# Patient Record
Sex: Male | Born: 1990 | Race: Black or African American | Hispanic: No | Marital: Single | Smoking: Current some day smoker
Health system: Southern US, Community
[De-identification: ages and names within clinical notes are randomized; demographics above are authoritative.]

---
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
## 2017-05-22 ENCOUNTER — Emergency Department (HOSPITAL_COMMUNITY)
Admission: EM | Admit: 2017-05-22 | Discharge: 2017-05-23 | Disposition: A | Discharge status: Home / Self Care | Payer: Self-pay | Attending: Emergency Medicine | Admitting: Emergency Medicine

## 2017-05-22 ENCOUNTER — Emergency Department (HOSPITAL_COMMUNITY): Payer: Self-pay

## 2017-05-22 ENCOUNTER — Encounter (HOSPITAL_COMMUNITY): Payer: Self-pay | Admitting: Emergency Medicine

## 2017-05-22 DIAGNOSIS — R360 Urethral discharge without blood: Secondary | ICD-10-CM | POA: Insufficient documentation

## 2017-05-22 DIAGNOSIS — N50811 Right testicular pain: Secondary | ICD-10-CM | POA: Insufficient documentation

## 2017-05-22 DIAGNOSIS — F17200 Nicotine dependence, unspecified, uncomplicated: Secondary | ICD-10-CM | POA: Insufficient documentation

## 2017-05-22 LAB — URINALYSIS, ROUTINE W REFLEX MICROSCOPIC
BILIRUBIN URINE: NEGATIVE
Glucose, UA: NEGATIVE mg/dL
HGB URINE DIPSTICK: NEGATIVE
Ketones, ur: NEGATIVE mg/dL
Leukocytes, UA: NEGATIVE
NITRITE: NEGATIVE
PROTEIN: NEGATIVE mg/dL
Specific Gravity, Urine: 1.013 (ref 1.005–1.030)
pH: 8 (ref 5.0–8.0)

## 2017-05-22 LAB — COMPREHENSIVE METABOLIC PANEL
ALT: 17 U/L (ref 17–63)
AST: 24 U/L (ref 15–41)
Albumin: 4.6 g/dL (ref 3.5–5.0)
Alkaline Phosphatase: 51 U/L (ref 38–126)
Anion gap: 11 (ref 5–15)
BUN: 7 mg/dL (ref 6–20)
CO2: 26 mmol/L (ref 22–32)
CREATININE: 1.16 mg/dL (ref 0.61–1.24)
Calcium: 9.7 mg/dL (ref 8.9–10.3)
Chloride: 100 mmol/L — ABNORMAL LOW (ref 101–111)
GFR calc Af Amer: >60 mL/min (ref >60)
Glucose, Bld: 100 mg/dL — ABNORMAL HIGH (ref 65–99)
POTASSIUM: 3.8 mmol/L (ref 3.5–5.1)
Sodium: 137 mmol/L (ref 135–145)
TOTAL PROTEIN: 7.7 g/dL (ref 6.5–8.1)
Total Bilirubin: 0.6 mg/dL (ref 0.3–1.2)

## 2017-05-22 LAB — CBC WITH DIFFERENTIAL/PLATELET
BASOS ABS: 0 10*3/uL (ref 0.0–0.1)
Basophils Relative: 1 %
EOS PCT: 1 %
Eosinophils Absolute: 0.1 10*3/uL (ref 0.0–0.7)
HEMATOCRIT: 42.6 % (ref 39.0–52.0)
Hemoglobin: 13.9 g/dL (ref 13.0–17.0)
LYMPHS ABS: 2.8 10*3/uL (ref 0.7–4.0)
LYMPHS PCT: 44 %
MCH: 27.6 pg (ref 26.0–34.0)
MCHC: 32.6 g/dL (ref 30.0–36.0)
MCV: 84.7 fL (ref 78.0–100.0)
MONO ABS: 0.5 10*3/uL (ref 0.1–1.0)
MONOS PCT: 7 %
NEUTROS ABS: 3 10*3/uL (ref 1.7–7.7)
Neutrophils Relative %: 47 %
PLATELETS: 342 10*3/uL (ref 150–400)
RBC: 5.03 MIL/uL (ref 4.22–5.81)
RDW: 13.4 % (ref 11.5–15.5)
WBC: 6.3 10*3/uL (ref 4.0–10.5)

## 2017-05-22 NOTE — ED Provider Notes (Signed)
[facility]-EMERGENCY DEPT Provider Note   CSN: [id number] Arrival date & time: 05/22/17  1914     History   Chief Complaint Chief Complaint  Patient presents with  . Right Testicle Throbbibg    HPI Larry Ayala is a 26 y.o. male.  The history is provided by the patient and medical records.    26 year old male here with lower abdominal discomfort and right testicular pain.  Reports she noticed this this morning. States his testicle hurts more than anything. States localized to right testicle only, described as a mild, throbbing sensation. He denies any dysuria or hematuria. No nausea or vomiting. States he did notice a little bit of clear penile discharge. Does report recent sexual intercourse.  No hx of STD.    History reviewed. No pertinent past medical history.  There are no active problems to display for this patient.   History reviewed. No pertinent surgical history.     Home Medications    Prior to Admission medications   Not on File    Family History No family history on file.  Social History Social History  Substance Use Topics  . Smoking status: Current Some Day Smoker  . Smokeless tobacco: Never Used  . Alcohol use No     Allergies   Patient has no known allergies.   Review of Systems Review of Systems  Gastrointestinal: Positive for abdominal pain.  Genitourinary: Positive for testicular pain.  All other systems reviewed and are negative.    Physical Exam Updated Vital Signs BP 130/70 (BP Location: Left Arm)   Pulse (!) 57   Temp 99 F (37.2 C) (Oral)   Resp 16   Ht 5\' 6"  (1.676 m)   Wt 65.3 kg (144 lb)   SpO2 100%   BMI 23.24 kg/m   Physical Exam  Constitutional: He is oriented to person, place, and time. He appears well-developed and well-nourished.  HENT:  Head: Normocephalic and atraumatic.  Mouth/Throat: Oropharynx is clear and moist.  Eyes: Conjunctivae and EOM are normal. Pupils are equal, round, and reactive to light.  Neck:  Normal range of motion.  Cardiovascular: Normal rate, regular rhythm and normal heart sounds.   Pulmonary/Chest: Effort normal and breath sounds normal.  Abdominal: Soft. Bowel sounds are normal. There is no tenderness. There is no rigidity and no guarding.  Genitourinary: Circumcised. Penile erythema present. Discharge (clear) found.  Genitourinary Comments: No significant testicular swelling or tenderness; normal lie; no masses; no lymphadenopathy; no hernia; small amount of clear penile discharge noted  Musculoskeletal: Normal range of motion.  Neurological: He is alert and oriented to person, place, and time.  Skin: Skin is warm and dry.  Psychiatric: He has a normal mood and affect.  Nursing note and vitals reviewed.    ED Treatments / Results  Labs (all labs ordered are listed, but only abnormal results are displayed) Labs Reviewed  COMPREHENSIVE METABOLIC PANEL - Abnormal; Notable for the following:       Result Value   Chloride 100 (*)    Glucose, Bld 100 (*)    All other components within normal limits  CBC WITH DIFFERENTIAL/PLATELET  URINALYSIS, ROUTINE W REFLEX MICROSCOPIC  GC/CHLAMYDIA PROBE AMP ([place]) NOT AT [facility]    EKG  EKG Interpretation None       Radiology Kore[place] Scrotum  Result Date: 05/23/2017 CLINICAL DATA:  Initial evaluation for acute right testicular pain. EXAM: SCROTAL ULTRASOUND DOPPLER ULTRASOUND OF THE TESTICLES TECHNIQUE: Complete ultrasound examination of the testicles, epididymis,  and other scrotal structures was performed. Color and spectral Doppler ultrasound were also utilized to evaluate blood flow to the testicles. COMPARISON:  None. FINDINGS: Right testicle Measurements: 4.5 x 2.0 x 3.2 cm. No mass or microlithiasis visualized. Left testicle Measurements: 4.0 x 1.8 x 3.0 cm. No mass or microlithiasis visualized. Right epididymis:  Normal in size and appearance. Left epididymis:  Normal in size and appearance. Hydrocele:  None visualized.  Varicocele:  None visualized. Pulsed Doppler interrogation of both testes demonstrates normal low resistance arterial and venous waveforms bilaterally. IMPRESSION: Normal testicular ultrasound.  No acute abnormality identified. Electronically Signed   By: Rise Mu M.D.   On: 05/23/2017 00:00   [place] Art/ven Flow Abd Pelv Doppler  Result Date: 05/23/2017 CLINICAL DATA:  Initial evaluation for acute right testicular pain. EXAM: SCROTAL ULTRASOUND DOPPLER ULTRASOUND OF THE TESTICLES TECHNIQUE: Complete ultrasound examination of the testicles, epididymis, and other scrotal structures was performed. Color and spectral Doppler ultrasound were also utilized to evaluate blood flow to the testicles. COMPARISON:  None. FINDINGS: Right testicle Measurements: 4.5 x 2.0 x 3.2 cm. No mass or microlithiasis visualized. Left testicle Measurements: 4.0 x 1.8 x 3.0 cm. No mass or microlithiasis visualized. Right epididymis:  Normal in size and appearance. Left epididymis:  Normal in size and appearance. Hydrocele:  None visualized. Varicocele:  None visualized. Pulsed Doppler interrogation of both testes demonstrates normal low resistance arterial and venous waveforms bilaterally. IMPRESSION: Normal testicular ultrasound.  No acute abnormality identified. Electronically Signed   By: Rise Mu M.D.   On: 05/23/2017 00:00    Procedures Procedures (including critical care time)  Medications Ordered in ED Medications - No data to display   Initial Impression / Assessment and Plan / ED Course  I have reviewed the triage vital signs and the nursing notes.  Pertinent labs & imaging results that were available during my care of the patient were reviewed by me and considered in my medical decision making (see chart for details).  26 year old male here with lower abdominal discomfort and right testicular pain. Denies any recent groin trauma. No urinary symptoms. Has noticed some clear penile discharge. He  is afebrile and nontoxic. Abdomen is soft and benign. No abnormalities noted on genital exam. Screening lab work reassuring. UA without signs of infection. Gc/chl pending.  Scrotal ultrasound Doppler obtained, no acute findings. Feel patient is appropriate for discharge. Referred to urology if any ongoing issues. Informed he will be notified if his STD cultures are positive or require treatment.  Discussed plan with patient, he acknowledged understanding and agreed with plan of care.  Return precautions given for new or worsening symptoms.  Final Clinical Impressions(s) / ED Diagnoses   Final diagnoses:  Pain in right testicle    New Prescriptions New Prescriptions   No medications on file     Garlon Hatchet, PA-C 05/23/17 1610    Jacalyn Lefevre, MD 05/23/17 2037

## 2017-05-22 NOTE — ED Triage Notes (Signed)
Patient reports right testicular " throbbing" onset today with mild low abdominal discomfort , denies dysuria , pt. suspects exposure to " black mold".

## 2017-05-23 NOTE — Discharge Instructions (Signed)
As we discussed, labs and ultrasound today looked good. Can follow-up with urology if any ongoing issues. Return to the ED for new or worsening symptoms.

## 2018-06-07 ENCOUNTER — Other Ambulatory Visit: Payer: Self-pay

## 2018-06-07 ENCOUNTER — Emergency Department (HOSPITAL_COMMUNITY): Payer: Self-pay

## 2018-06-07 ENCOUNTER — Encounter (HOSPITAL_COMMUNITY): Payer: Self-pay | Admitting: Emergency Medicine

## 2018-06-07 ENCOUNTER — Emergency Department (HOSPITAL_COMMUNITY)
Admission: EM | Admit: 2018-06-07 | Discharge: 2018-06-07 | Disposition: A | Discharge status: Home / Self Care | Payer: Self-pay | Attending: Emergency Medicine | Admitting: Emergency Medicine

## 2018-06-07 DIAGNOSIS — S62366A Nondisplaced fracture of neck of fifth metacarpal bone, right hand, initial encounter for closed fracture: Secondary | ICD-10-CM

## 2018-06-07 DIAGNOSIS — W228XXA Striking against or struck by other objects, initial encounter: Secondary | ICD-10-CM | POA: Insufficient documentation

## 2018-06-07 DIAGNOSIS — Y998 Other external cause status: Secondary | ICD-10-CM | POA: Insufficient documentation

## 2018-06-07 DIAGNOSIS — Z4789 Encounter for other orthopedic aftercare: Secondary | ICD-10-CM | POA: Insufficient documentation

## 2018-06-07 DIAGNOSIS — Z4689 Encounter for fitting and adjustment of other specified devices: Secondary | ICD-10-CM

## 2018-06-07 DIAGNOSIS — Y929 Unspecified place or not applicable: Secondary | ICD-10-CM | POA: Insufficient documentation

## 2018-06-07 DIAGNOSIS — Y9389 Activity, other specified: Secondary | ICD-10-CM | POA: Insufficient documentation

## 2018-06-07 DIAGNOSIS — S62346B Nondisplaced fracture of base of fifth metacarpal bone, right hand, initial encounter for open fracture: Secondary | ICD-10-CM | POA: Insufficient documentation

## 2018-06-07 NOTE — ED Triage Notes (Signed)
Wants cast removed from RT forearm has been on for 9 weeks.  Pt states he has not followed up orthopedic Dr in [place] that applied the cast.

## 2018-06-07 NOTE — Discharge Instructions (Signed)
Alternate 600 mg of ibuprofen and [phone] mg of Tylenol every 3 hours as needed for pain. Do not exceed 4000 mg of Tylenol daily.  Take ibuprofen with food.  Apply ice for comfort.  Follow-up with an orthopedist if you have any ongoing problems.  Return to the emergency department if any concerning signs or symptoms develop such as fever, swelling, loss of pulses, or loss of sensation.
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
## 2019-07-29 IMAGING — DX DG WRIST COMPLETE 3+V*R*
3 series · 3 of 3 positions shown · non-contrast
Comparison: None.

CLINICAL DATA: History of fracture of the fifth metacarpal 2 months
ago still in cast

EXAM:
RIGHT WRIST - COMPLETE 3+ VIEW

[wrist pa]
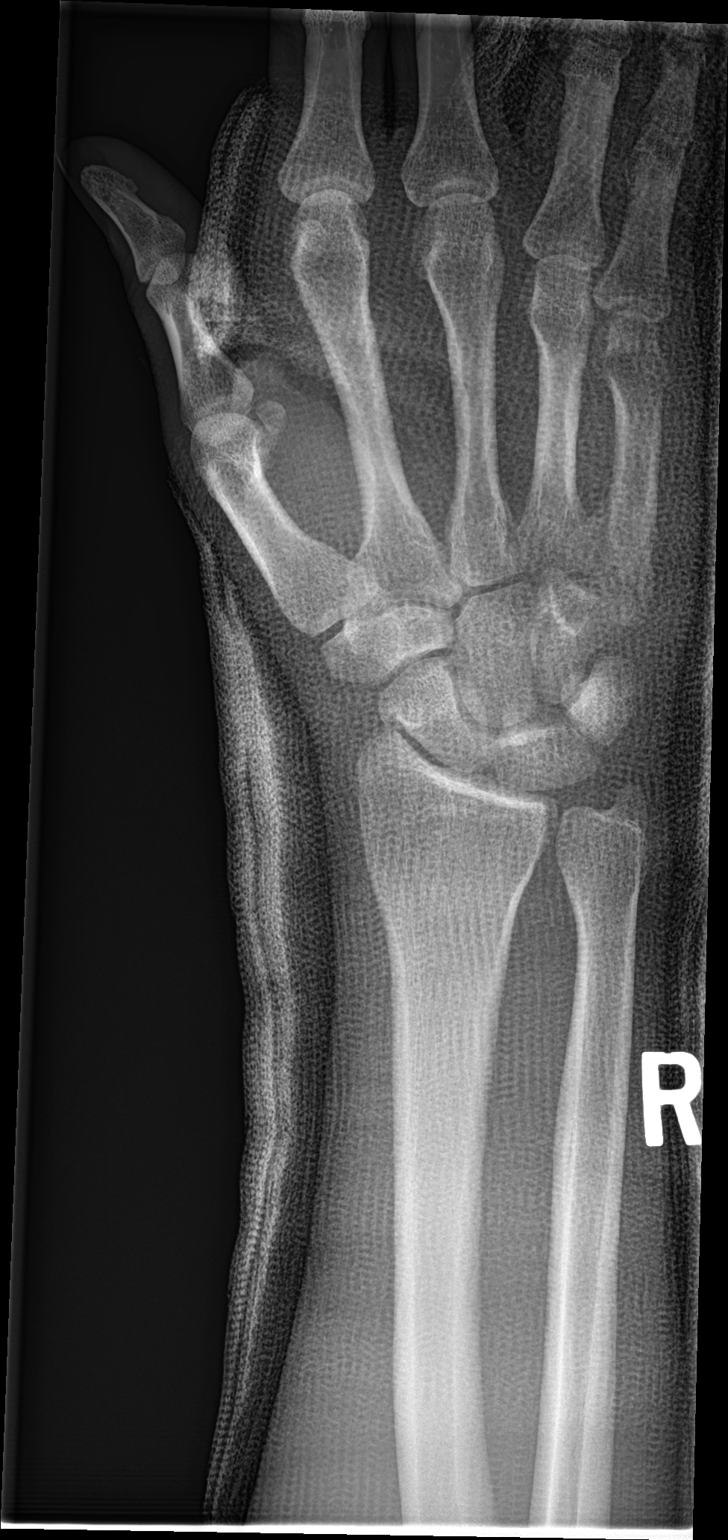

[wrist obl]
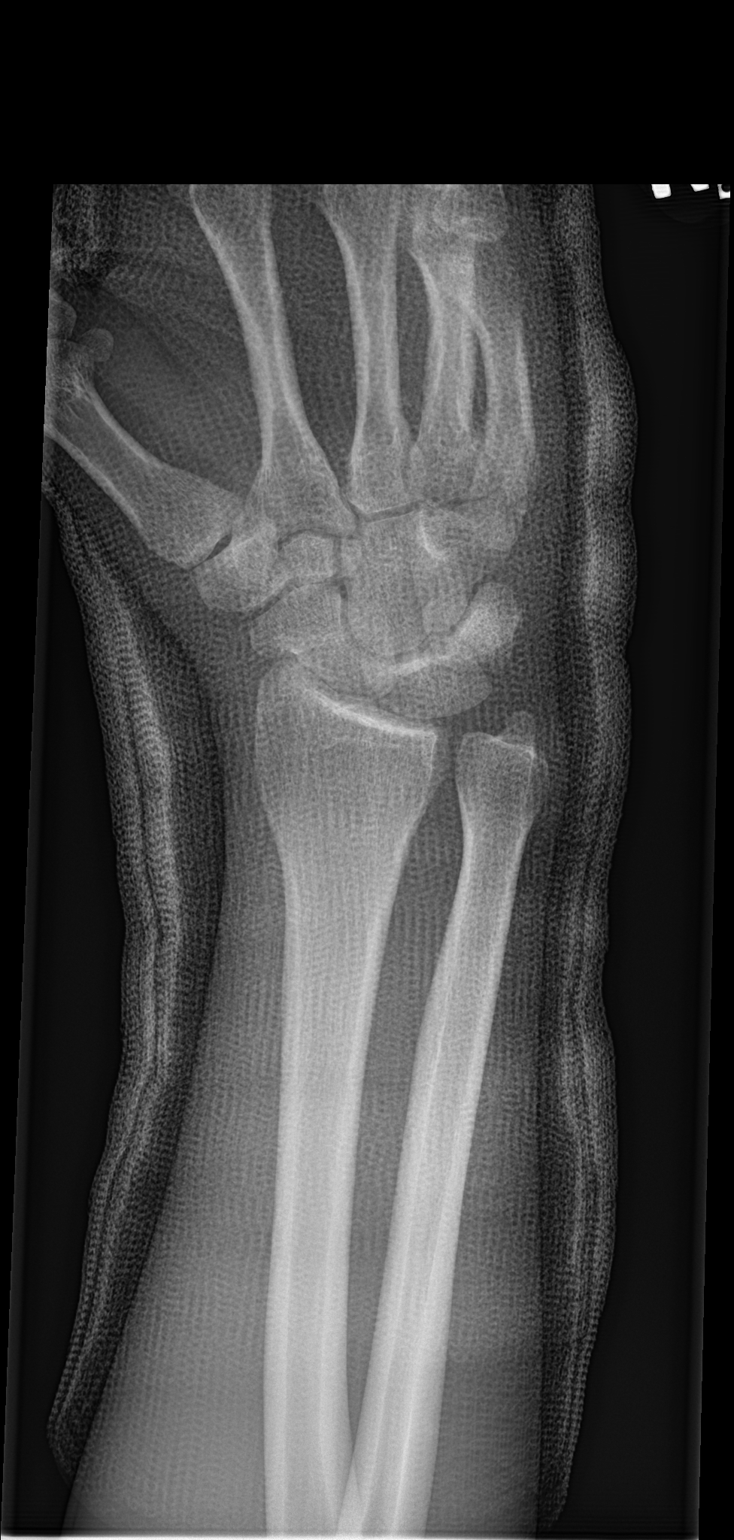

[wrist lat]
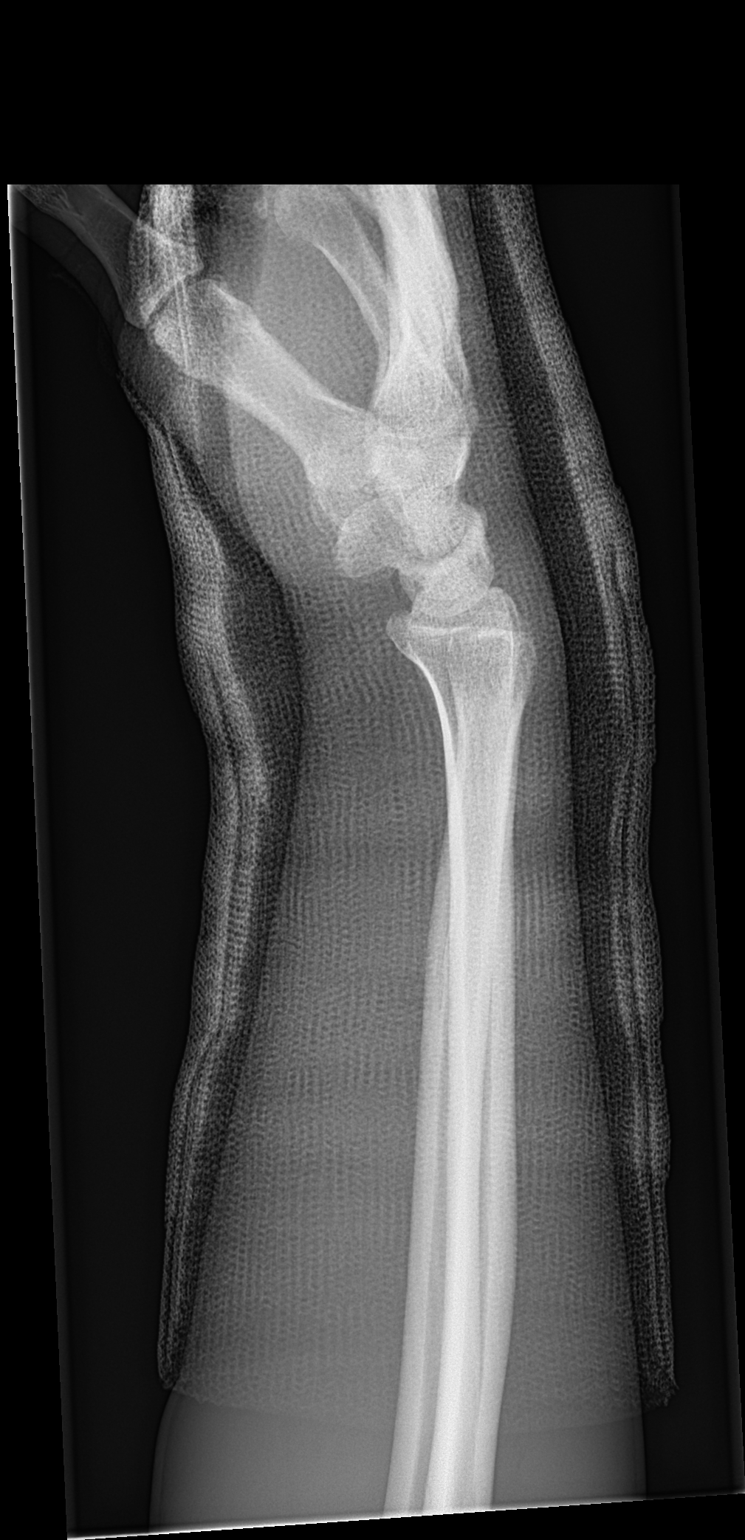

[3 of 3 positions shown; findings below may reference images not displayed]

FINDINGS: Unfortunately bony detail is obscured by overlying cast. However
there does appear to be callus formation around the fracture of the
base of the fifth metacarpal. The radiocarpal joint space appears
normal and the carpal bones are in normal position. The carpal bones
are normally aligned.
IMPRESSION: Some callus formation is noted around the fracture of the base of
the fifth metacarpal, but bony detail is obscured by overlying cast.

## 2019-08-12 ENCOUNTER — Emergency Department (HOSPITAL_COMMUNITY): Payer: Self-pay

## 2019-08-12 ENCOUNTER — Other Ambulatory Visit: Payer: Self-pay

## 2019-08-12 ENCOUNTER — Emergency Department (HOSPITAL_COMMUNITY)
Admission: EM | Admit: 2019-08-12 | Discharge: 2019-08-12 | Disposition: A | Discharge status: Home / Self Care | Payer: Self-pay | Attending: Emergency Medicine | Admitting: Emergency Medicine

## 2019-08-12 DIAGNOSIS — R070 Pain in throat: Secondary | ICD-10-CM | POA: Insufficient documentation

## 2019-08-12 DIAGNOSIS — K226 Gastro-esophageal laceration-hemorrhage syndrome: Secondary | ICD-10-CM | POA: Insufficient documentation

## 2019-08-12 DIAGNOSIS — F1210 Cannabis abuse, uncomplicated: Secondary | ICD-10-CM | POA: Insufficient documentation

## 2019-08-12 DIAGNOSIS — F17210 Nicotine dependence, cigarettes, uncomplicated: Secondary | ICD-10-CM | POA: Insufficient documentation

## 2019-08-12 LAB — CBC
HCT: 47.1 % (ref 39.0–52.0)
Hemoglobin: 14.7 g/dL (ref 13.0–17.0)
MCH: 27.8 pg (ref 26.0–34.0)
MCHC: 31.2 g/dL (ref 30.0–36.0)
MCV: 89.2 fL (ref 80.0–100.0)
Platelets: 361 10*3/uL (ref 150–400)
RBC: 5.28 MIL/uL (ref 4.22–5.81)
RDW: 13.6 % (ref 11.5–15.5)
WBC: 11.5 10*3/uL — ABNORMAL HIGH (ref 4.0–10.5)
nRBC: 0 % (ref 0.0–0.2)

## 2019-08-12 LAB — BASIC METABOLIC PANEL
Anion gap: 11 (ref 5–15)
BUN: 11 mg/dL (ref 6–20)
CO2: 27 mmol/L (ref 22–32)
Calcium: 10 mg/dL (ref 8.9–10.3)
Chloride: 104 mmol/L (ref 98–111)
Creatinine, Ser: 0.97 mg/dL (ref 0.61–1.24)
GFR calc Af Amer: >60 mL/min (ref >60)
GFR calc non Af Amer: >60 mL/min (ref >60)
Glucose, Bld: 97 mg/dL (ref 70–99)
Potassium: 4.3 mmol/L (ref 3.5–5.1)
Sodium: 142 mmol/L (ref 135–145)

## 2019-08-12 MED ORDER — PROMETHAZINE HCL 25 MG PO TABS: 25.0000 mg | ORAL_TABLET | Freq: Four times a day (QID) | ORAL | 0 refills | Status: AC | PRN

## 2019-08-12 MED ORDER — IOHEXOL 300 MG/ML  SOLN
75.0000 mL | Freq: Once | INTRAMUSCULAR | Status: AC | PRN
  Administered 2019-08-12: 15:00:00 75 mL via INTRAVENOUS

## 2019-08-12 MED ORDER — LIDOCAINE VISCOUS HCL 2 % MT SOLN: 15.0000 mL | OROMUCOSAL | 0 refills | Status: AC | PRN

## 2019-08-12 NOTE — Discharge Instructions (Addendum)
You were seen in the ER for throat discomfort after vomiting.    CT of your neck today is normal and does not show any foreign bodies or abnormalities in your neck or throat.  For now we will treat your symptoms with antiacid medicines, soft diet.  I suspect you may have caused a slight superficial tear in your esophagus from vomiting.  Small tears usually heal on their own over a few days and are treated with anti-acid medicines, nausea and vomiting control, diet changes.   Take 40 mg of omeprazole (over the counter) every morning on an empty stomach 20 to 30 minutes before eating or drinking.  Start a soft and gentle diet.  Avoid any dry, hard foods.  Avoid any irritating foods such as spicy food, greasy foods or acidic foods.  Avoid alcohol.  Take phenergan every 6 hours for the next 3-5 days to prevent nausea, we need to avoid further vomiting to let esophagus heal.  Oral lidocaine solution can be used to alleviate discomfort.  Gargle approx 15 ml every 3 hours, this may be swallowed.    Your symptoms may take several days to start to improve.  If your symptoms persist and are not improving in the next 3 to 5 days you need to be evaluated by a gastroenterologist.  Symptoms gastroenterology does a throat scope to look at the esophagus and tear.  Return to the ER if there is sudden worsening of throat discomfort, chest pain, shortness of breath, persistent vomiting or blood in your vomit, blood in stool

## 2019-08-12 NOTE — ED Triage Notes (Signed)
Pt says he had rice and shrimp last night.  Woke up this morning and vomited twice and feels like something in his throat.  Able to feel something in throats when he turns his neck.  Denies pain.

## 2019-08-12 NOTE — ED Provider Notes (Addendum)
[facility] EMERGENCY DEPARTMENT Provider Note   CSN: [id number] Arrival date & time: 08/12/19  1216     History   Chief Complaint Chief Complaint  Patient presents with   Airway Obstruction    HPI Larry Ayala is a 28 y.o. male presents to the ER for evaluation of throat discomfort.  Patient states last night he had [place] food very late.  He ate shrimp and rice.  This morning he woke up around 9:30 AM and felt nauseated.  He threw up and after the first time of throwing up he noticed something getting stuck in his throat around his Adam's apple.  Still feels like there is something in there.  He threw up several more times.  On the last occasion he made himself throw up to try and get his throat to feel better.  He noticed some bright red blood streaks in his vomit but with this otherwise yellow and contained food.  He is not sure if this was blood because he had [place] last night as well.  Has since had associated throat discomfort and pain.  It is sore if he touches his Adams apple.  It also feels more uncomfortable if he turns his head to the left and right.  He drank 2 ginger ale's and threw it back up, has been burping since but no relief.  His voice sounds a little bit raspy.  He denies any lip or tongue swelling, chest pain, shortness of breath, pain with breathing, abdominal pain.  No diarrhea.  No fever. He is no longer nauseous.      HPI  No past medical history on file.  There are no active problems to display for this patient.   No past surgical history on file.      Home Medications    Prior to Admission medications   Medication Sig Start Date End Date Taking? Authorizing Provider  lidocaine (XYLOCAINE) 2 % solution Use as directed 15 mLs in the mouth or throat as needed for mouth pain. 08/12/19   Liberty Handy, PA-C  promethazine (PHENERGAN) 25 MG tablet Take 1 tablet (25 mg total) by mouth every 6 (six) hours as needed for nausea or vomiting. 08/12/19    Liberty Handy, PA-C    Family History No family history on file.  Social History Social History   Tobacco Use   Smoking status: Current Some Day Smoker   Smokeless tobacco: Never Used  Substance Use Topics   Alcohol use: No   Drug use: Not Currently    Types: Marijuana     Allergies   Patient has no known allergies.   Review of Systems Review of Systems  HENT: Positive for trouble swallowing and voice change.   Gastrointestinal: Positive for nausea and vomiting.  All other systems reviewed and are negative.    Physical Exam Updated Vital Signs BP 125/83    Pulse 70    Temp 98 F (36.7 C) (Oral)    Resp 16    Ht 5\' 6"  (1.676 m)    Wt 61.2 kg    SpO2 99%    BMI 21.79 kg/m   Physical Exam Vitals signs and nursing note reviewed.  Constitutional:      Appearance: He is well-developed.     Comments: Non toxic.  HENT:     Head: Normocephalic and atraumatic.     Nose: Nose normal.     Mouth/Throat:     Comments: Phonation is normal.  No  lip or tongue angioedema.  Easily visualized oropharynx and tonsils without any abnormalities.  Normal sublingual space.  Tolerating secretions. Eyes:     Conjunctiva/sclera: Conjunctivae normal.  Neck:     Musculoskeletal: Normal range of motion.     Comments: Mild tenderness to thyroid cartilage but no other anterior lateral neck tenderness, crepitus.  Trachea is midline.  Full range of motion of the neck, discomfort noted with extension and right/left rotation. Cardiovascular:     Rate and Rhythm: Normal rate and regular rhythm.     Heart sounds: Normal heart sounds.  Pulmonary:     Effort: Pulmonary effort is normal.     Breath sounds: Normal breath sounds.     Comments: No crepitus to anterior chest wall  Abdominal:     General: Bowel sounds are normal.     Palpations: Abdomen is soft.     Tenderness: There is no abdominal tenderness.     Comments: No G/R/R. No suprapubic or CVA tenderness. Negative Murphy's and  McBurney's  Musculoskeletal: Normal range of motion.  Skin:    General: Skin is warm and dry.     Capillary Refill: Capillary refill takes less than 2 seconds.  Neurological:     Mental Status: He is alert.  Psychiatric:        Behavior: Behavior normal.      ED Treatments / Results  Labs (all labs ordered are listed, but only abnormal results are displayed) Labs Reviewed  CBC - Abnormal; Notable for the following components:      Result Value   WBC 11.5 (*)    All other components within normal limits  BASIC METABOLIC PANEL    EKG None  Radiology Ct Soft Tissue Neck W Contrast  Result Date: 08/12/2019 CLINICAL DATA:  Awoke today with swelling in the throat and pain. Possible allergic reaction to food from last night. EXAM: CT NECK WITH CONTRAST TECHNIQUE: Multidetector CT imaging of the neck was performed using the standard protocol following the bolus administration of intravenous contrast. CONTRAST:  72mL OMNIPAQUE IOHEXOL 300 MG/ML  SOLN COMPARISON:  None. FINDINGS: Pharynx and larynx: Normal. No evidence of inflammatory change or angio edema. Salivary glands: Parotid and submandibular glands are normal. Thyroid: Normal Lymph nodes: No enlarged or low-density nodes on either side of the neck. Vascular: Normal Limited intracranial: Normal Visualized orbits: Normal Mastoids and visualized paranasal sinuses: Clear Skeleton: Normal Upper chest: Normal Other: None IMPRESSION: Normal examination. No convincing evidence of mucosal inflammation or angio edema. Electronically Signed   By: Nelson Chimes M.D.   On: 08/12/2019 15:15    Procedures Procedures (including critical care time)  Medications Ordered in ED Medications  iohexol (OMNIPAQUE) 300 MG/ML solution 75 mL (75 mLs Intravenous Contrast Given 08/12/19 1452)     Initial Impression / Assessment and Plan / ED Course  I have reviewed the triage vital signs and the nursing notes.  Pertinent labs & imaging results that were  available during my care of the patient were reviewed by me and considered in my medical decision making (see chart for details).  Clinical Course as of Aug 11 1545  Sat Aug 12, 2019  1530 IMPRESSION: Normal examination. No convincing evidence of mucosal inflammation or angio edema   CT Soft Tissue Neck W Contrast [CG]    Clinical Course User Index [CG] Kinnie Feil, PA-C   Given history of high suspicion for Mallory-Weiss tear.  Patient had large meal of [place] food last night and woke up  nauseated, vomited several times before symptoms began.  He also had some bright streaks of blood in his emesis and now with some throat discomfort.  Exam is benign, he is tolerating secretions, normal phonation, normal oropharyngeal exam.  He has no chest pain, shortness of breath.  He is hemodynamically stable.  No abdominal pain.  He has not had recurrence of vomiting or bloody emesis since arrival.  He is tolerating fluids here.  Labs reviewed by me.  CT ordered by EDP at triage reviewed by me and radiology, no radiopaque foreign bodies, acute abnormalities noted.  I reevaluated patient he continues to have mild discomfort with swallowing but is tolerating secretions.  No clinical decline.  Given benign exam and history, work-up here I doubt full-thickness tear, perforated ulcer.  He has no abdominal tenderness, history of gastritis, PUD.  He is not on blood thinners.  He is considered low risk for recurrent or life-threatening bleeding.  No known liver or varices history.  Considered low risk for complications from Mallory-Weiss tear.  No previous head neck or other trauma to suggest occult tracheal injury.  He has no chest pain or shortness of breath and I doubt pneumothorax or other intrathoracic pathology.  I do not think he needs emergent GI evaluation or endoscopy here.  Will discharge with omeprazole, lidocaine viscus solution, diet modifications and close GI follow-up.  Instructed patient to call GI  today to make an appointment for reevaluation if symptoms do not improve.  Strict return precautions discussed with patient and girlfriend at bedside, they are in agreement with ER treatment and discharge plan.  Final Clinical Impressions(s) / ED Diagnoses   Final diagnoses:  Throat discomfort  Mallory-Weiss syndrome    ED Discharge Orders         Ordered    promethazine (PHENERGAN) 25 MG tablet  Every 6 hours PRN     08/12/19 1541    lidocaine (XYLOCAINE) 2 % solution  As needed     08/12/19 1545           Liberty HandyGibbons, Pinkie Manger J, PA-C 08/12/19 1624    Vanetta MuldersZackowski, Scott, MD 08/14/19 1221
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
## 2021-07-01 IMAGING — CT CT NECK W/ CM
4 of 8 series · 14 of 33 positions shown, 15 images · IV contrast (omnipaque)
Comparison: None.

CLINICAL DATA: Awoke today with swelling in the throat and pain.
Possible allergic reaction to food from last night.

EXAM:
CT NECK WITH CONTRAST
TECHNIQUE: Multidetector CT imaging of the neck was performed using the
standard protocol following the bolus administration of intravenous
contrast.
CONTRAST:  75mL OMNIPAQUE IOHEXOL 300 MG/ML  SOLN

[Series 2: axial neck · axial · 0.50mm/px · z∈[+77,+221]mm · 4 of 122 slices shown, 5 images]
[im 25/122  soft-tissue]
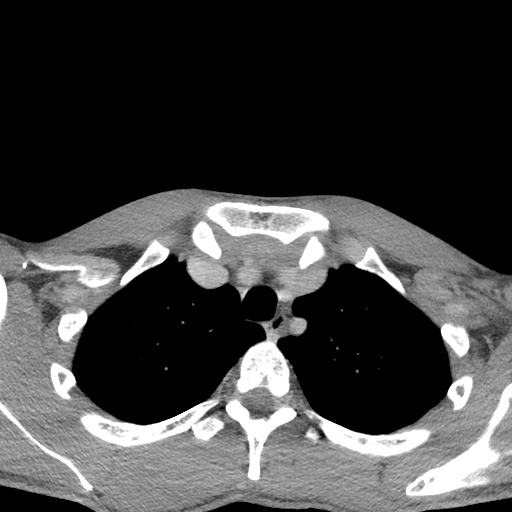
[im 25/122  bone]
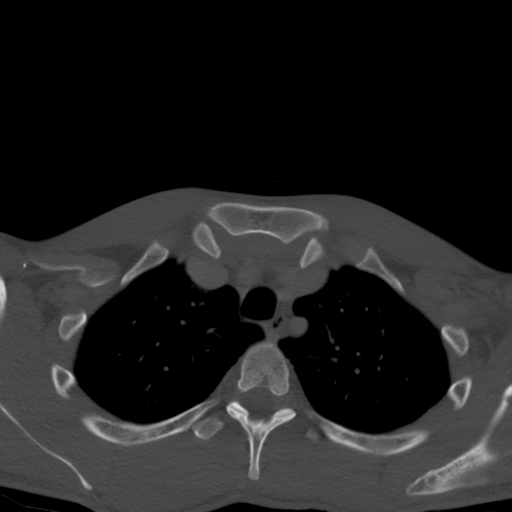
[im 49/122  bone]
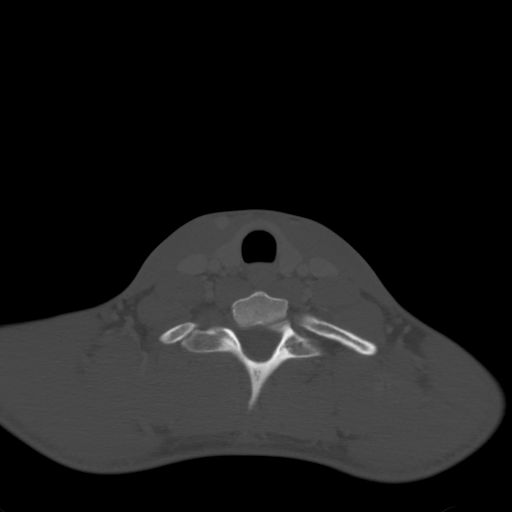
[im 73/122  bone]
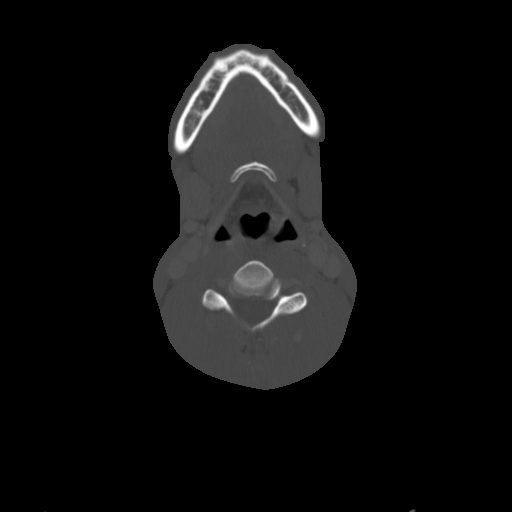
[im 97/122  bone]
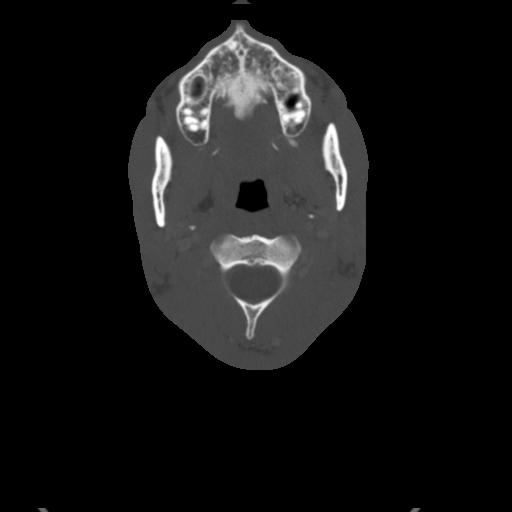

[Series 6: cor neck · coronal · 0.46mm/px · 1 of 119 slices shown]
[im 60/119  bone]
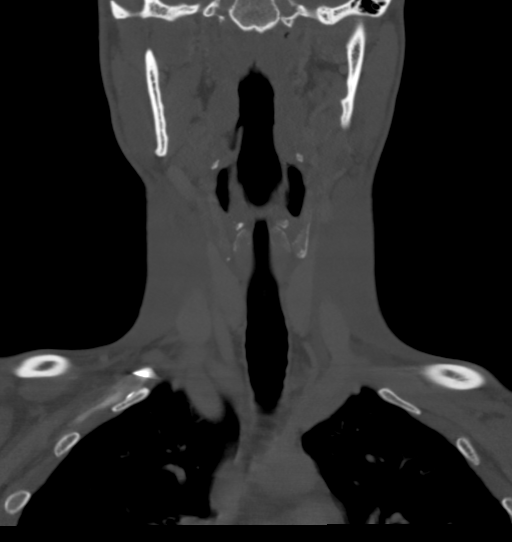

[Series 8: sag neck · sagittal · 0.47mm/px · 5 of 118 slices shown]
[im 20/118  bone]
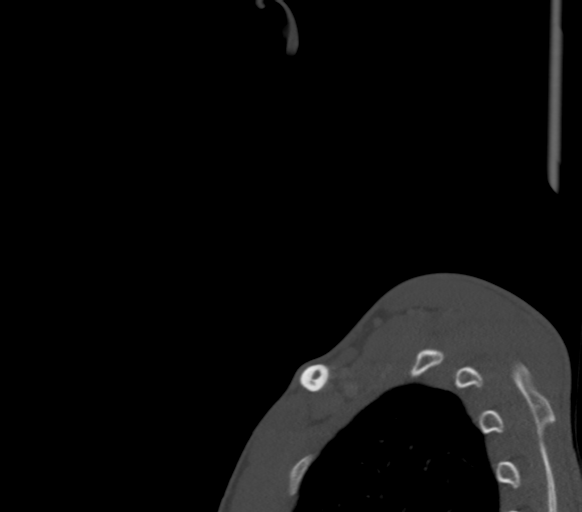
[im 40/118  bone]
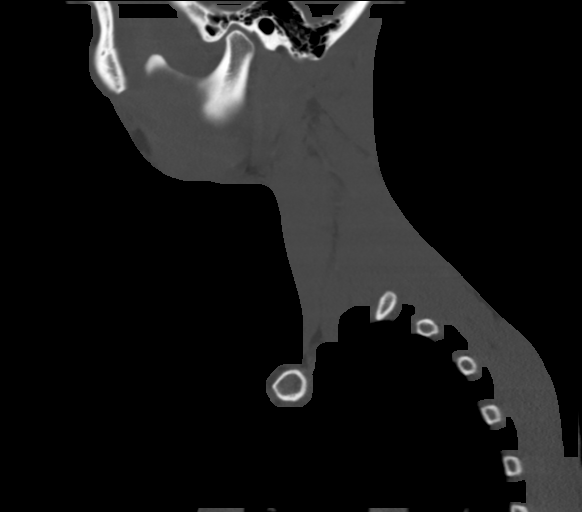
[im 59/118  bone]
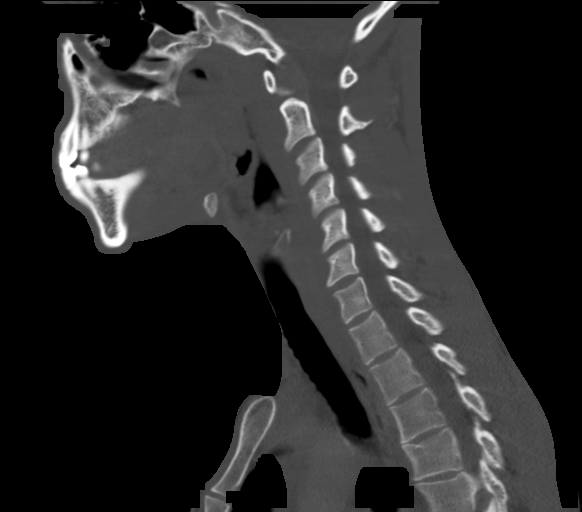
[im 79/118  bone]
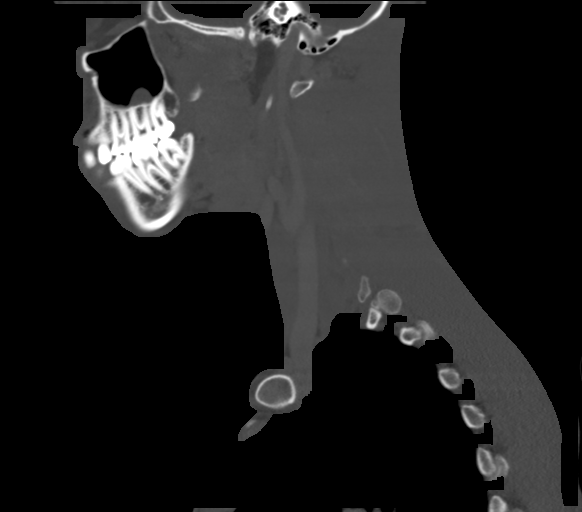
[im 98/118  bone]
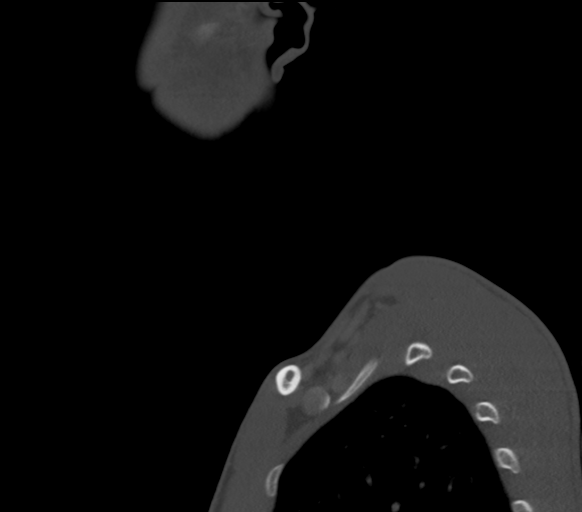

[Series 10: ax oropharynx · axial · 0.45mm/px · z∈[+34,+179]mm · 4 of 128 slices shown]
[im 26/128  bone]
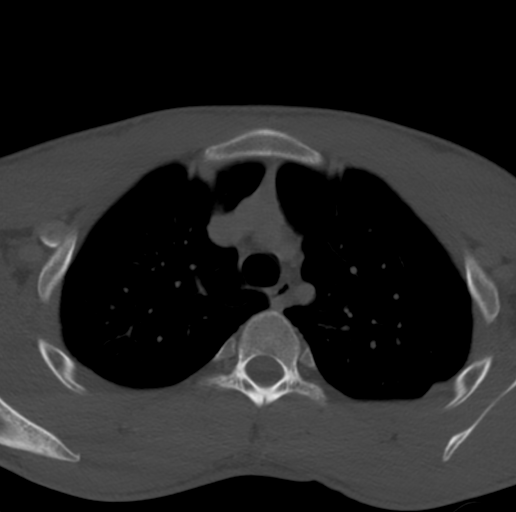
[im 51/128  bone]
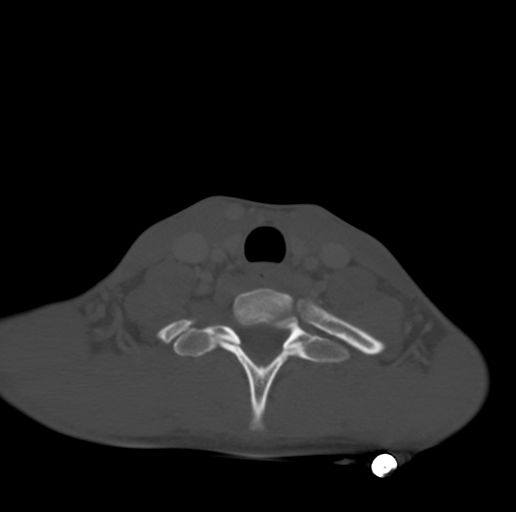
[im 77/128  bone]
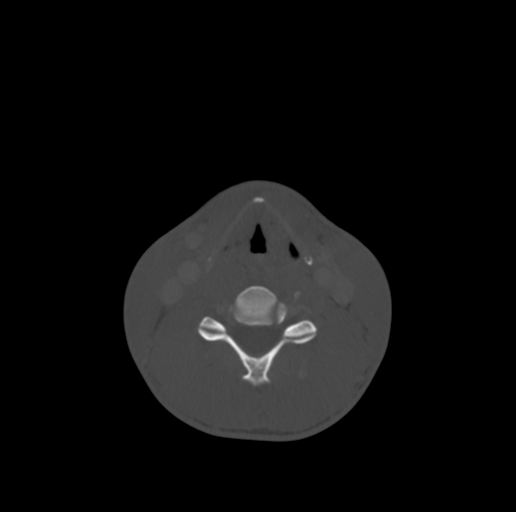
[im 102/128  bone]
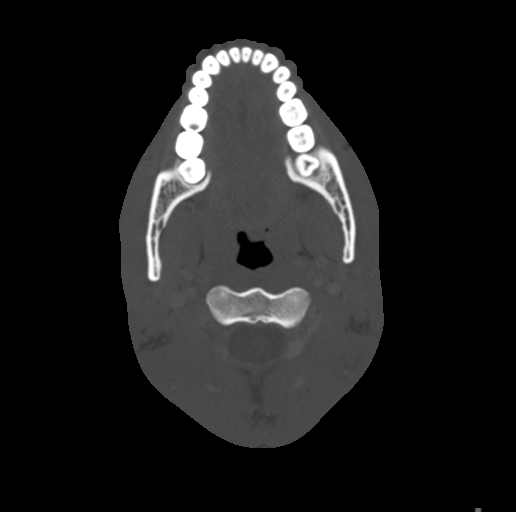

[14 of 33 positions shown; findings below may reference images not displayed]

FINDINGS: Pharynx and larynx: Normal. No evidence of inflammatory change or
angio edema.

Salivary glands: Parotid and submandibular glands are normal.

Thyroid: Normal

Lymph nodes: No enlarged or low-density nodes on either side of the
neck.

Vascular: Normal

Limited intracranial: Normal

Visualized orbits: Normal

Mastoids and visualized paranasal sinuses: Clear

Skeleton: Normal

Upper chest: Normal

Other: None
IMPRESSION: Normal examination. No convincing evidence of mucosal inflammation
or angio edema.
